# Patient Record
Sex: Male | Born: 1960 | Race: White | Hispanic: No | Marital: Married | Smoking: Never smoker
Health system: Southern US, Community
[De-identification: ages and names within clinical notes are randomized; demographics above are authoritative.]

## PROBLEM LIST (undated history)

## (undated) DIAGNOSIS — G4733 Obstructive sleep apnea (adult) (pediatric): Secondary | ICD-10-CM

## (undated) HISTORY — PX: UVULOPALATOPHARYNGOPLASTY (UPPP)/TONSILLECTOMY/SEPTOPLASTY: SHX6164

## (undated) HISTORY — DX: Obstructive sleep apnea (adult) (pediatric): G47.33

---

## 1998-09-05 ENCOUNTER — Ambulatory Visit: Admission: RE | Admit: 1998-09-05 | Discharge: 1998-09-05 | Payer: Self-pay | Admitting: Otolaryngology

## 1999-03-01 ENCOUNTER — Encounter: Payer: Self-pay | Admitting: Otolaryngology

## 1999-03-02 ENCOUNTER — Encounter (INDEPENDENT_AMBULATORY_CARE_PROVIDER_SITE_OTHER): Payer: Self-pay | Admitting: *Deleted

## 1999-03-02 ENCOUNTER — Ambulatory Visit (HOSPITAL_COMMUNITY): Admission: RE | Admit: 1999-03-02 | Discharge: 1999-03-03 | Payer: Self-pay | Admitting: Otolaryngology

## 1999-06-26 ENCOUNTER — Ambulatory Visit: Admission: RE | Admit: 1999-06-26 | Discharge: 1999-06-26 | Payer: Self-pay | Admitting: Otolaryngology

## 2008-04-12 ENCOUNTER — Ambulatory Visit (HOSPITAL_BASED_OUTPATIENT_CLINIC_OR_DEPARTMENT_OTHER): Admission: RE | Admit: 2008-04-12 | Discharge: 2008-04-12 | Payer: Self-pay | Admitting: Orthopaedic Surgery

## 2010-05-24 LAB — POCT HEMOGLOBIN-HEMACUE: Hemoglobin: 16.4 g/dL (ref 13.0–17.0)

## 2010-06-26 NOTE — Op Note (Signed)
Jared Contreras, GRABEL                 ACCOUNT NO.:  [id number]   MEDICAL RECORD NO.:  [id number]          PATIENT TYPE:  AMB   LOCATION:  [facility]                          FACILITY:  [facility]   PHYSICIAN:  Lubertha Basque. Dalldorf, M.D.DATE OF BIRTH:  1960-07-10   DATE OF PROCEDURE:  04/12/2008  DATE OF DISCHARGE:                               OPERATIVE REPORT   PREOPERATIVE DIAGNOSIS:  Right first metatarsal fracture.   POSTOPERATIVE DIAGNOSIS:  Right first metatarsal fracture.   PROCEDURE:  Right first metatarsal open reduction and internal fixation.   ANESTHESIA:  General and block.   ATTENDING SURGEON:  Lubertha Basque. Jerl Santos, MD   ASSISTANT:  Lindwood Qua, PA   INDICATIONS FOR PROCEDURE:  The patient is a 50 year old man who was  injured on the job when a large steel plate fell on his foot about a  week back.  He suffered displaced first metatarsal fracture.  He is  offered ORIF in hopes of realigning his foot.  Informed operative  consent was obtained after discussion of possible complications  including reaction to anesthesia, infection, and nonunion.   SUMMARY, FINDINGS, AND PROCEDURE:  Under general anesthesia and a block,  through a dorsomedial longitudinal incision, the first metatarsal was  reduced anatomically and stabilized with a one-third tubular Synthes  plate with 4 bicortical screws.  I used fluoroscopy throughout the case  to make appropriate intraoperative decisions and read all these views  myself.  Bryna Colander assisted throughout and was invaluable to the  completion of the case, mostly in that he maintained reduction while I  placed hardware.  He also closed simultaneously to help minimize OR  time.   DESCRIPTION OF PROCEDURE:  The patient was taken to the operating suite  where general anesthetic was applied without difficulty.  He was also  given a bit of a block in the operating room.  He was then positioned  supine and prepped and draped in normal sterile fashion.   After  administration of IV Kefzol, the right leg was elevated, exsanguinated,  and tourniquet inflated about the calf.  A longitudinal dorsomedial  incision was made along the first metatarsal shaft.  Dissection was  carried down and some subperiosteal dissection was performed.  His  fracture was visualized and reduced anatomically and stabilized with a  reduction clamp.  We then contoured a one-third tubular 4-hole plate  appropriately and placed this along with bone.  We then placed 4  bicortical screws through excellent bone.  We achieved excellent  fixation and elected to use the one-third tubular plate as the bone was  in a fairly subcutaneous position and I did not think he would tolerate  the DCP plate which we would normally use for fractures.  Nevertheless,  fluoroscopy was used to confirm excellent reduction of the fracture and  placement of hardware.  The wound was irrigated and the tourniquet was  deflated.  His toes became pink and warm immediately.  Mild amount of  bleeding was easily controlled with some pressure.  The wound was  irrigated again, followed by reapproximation of deep tissues  with 2-0  undyed Vicryl and skin with nylon.  Adaptic was applied followed by dry  gauze and an Ace wrap and posterior splint of plaster with ankle in  neutral position.  Estimated blood loss and intraoperative fluids  obtained from anesthesia records as can accurate tourniquet time.   DISPOSITION:  The patient was extubated in the operating room and taken  to recovery room in stable addition.  He is to go home same-day and  follow up with me in the office less than a week.  I will contact him by  phone tonight.      Lubertha Basque Jerl Santos, M.D.  Electronically Signed     PGD/MEDQ  D:  04/12/2008  T:  04/13/2008  Job:  [id number]

## 2011-10-03 ENCOUNTER — Other Ambulatory Visit: Payer: Self-pay | Admitting: Gastroenterology

## 2011-10-03 DIAGNOSIS — R1310 Dysphagia, unspecified: Secondary | ICD-10-CM

## 2011-10-09 ENCOUNTER — Ambulatory Visit
Admission: RE | Admit: 2011-10-09 | Discharge: 2011-10-09 | Disposition: A | Discharge status: Home / Self Care | Payer: 59 | Source: Ambulatory Visit | Attending: Gastroenterology | Admitting: Gastroenterology

## 2011-10-09 DIAGNOSIS — R1310 Dysphagia, unspecified: Secondary | ICD-10-CM

## 2011-10-31 ENCOUNTER — Other Ambulatory Visit: Payer: Self-pay | Admitting: Gastroenterology

## 2013-04-29 ENCOUNTER — Encounter: Payer: Self-pay | Admitting: Pulmonary Disease

## 2013-04-29 ENCOUNTER — Ambulatory Visit (INDEPENDENT_AMBULATORY_CARE_PROVIDER_SITE_OTHER): Payer: 59 | Admitting: Pulmonary Disease

## 2013-04-29 VITALS — BP 130/80 | HR 64 | Ht 73.0 in | Wt 211.0 lb

## 2013-04-29 DIAGNOSIS — R0683 Snoring: Secondary | ICD-10-CM

## 2013-04-29 DIAGNOSIS — R0989 Other specified symptoms and signs involving the circulatory and respiratory systems: Secondary | ICD-10-CM

## 2013-04-29 DIAGNOSIS — G4733 Obstructive sleep apnea (adult) (pediatric): Secondary | ICD-10-CM | POA: Insufficient documentation

## 2013-04-29 DIAGNOSIS — R0609 Other forms of dyspnea: Secondary | ICD-10-CM

## 2013-04-29 NOTE — Patient Instructions (Signed)
Will arrange for sleep study Will call to arrange for follow up after sleep study reviewed 

## 2013-04-29 NOTE — Progress Notes (Signed)
Chief Complaint  Patient presents with  . Sleep Consult    referred by Dr. Shelia Media for OSA. Epworth Score: 7.    History of Present Illness: Jared Contreras is a 53 y.o. male for evaluation of sleep problems.  He had sleep study about 12 years ago, and was found to have severe sleep apnea.  He tried CPAP, but couldn't tolerate this.  He then had UPPP.  This helped initially, but he was told his sleep apnea was still severe.  He then retried CPAP, but couldn't tolerate again.  As a result he stopped CPAP.  His sleep has gotten worse over the past few years.  He is snoring again, and his wife is concerned he stops breathing while asleep.  He goes to sleep at 11 pm.  He falls asleep quickly.  He wakes up 2 times to use the bathroom.  He gets out of bed at 630 am.  He feels okay in the morning.  He denies morning headache.  He does not use anything to help him fall sleep.  He drinks 2 cups of coffee in the morning.  He denies sleep walking, sleep talking, bruxism, or nightmares.  There is no history of restless legs.  He denies sleep hallucinations, sleep paralysis, or cataplexy.  The Epworth score is 7 out of 24.  Jared Contreras  has a past medical history of OSA (obstructive sleep apnea).  Jared Contreras  has past surgical history that includes Uvulopalatopharyngoplasty (uppp)/tonsillectomy/septoplasty.  Prior to Admission medications   Not on File    Allergies  Allergen Reactions  . Asa [Aspirin] Anaphylaxis    His family history includes Alcoholism in his father; Skin cancer in his mother; Suicidality in his father.  He  reports that he has never smoked. He has never used smokeless tobacco. He reports that he does not drink alcohol or use illicit drugs.   Physical Exam:  General - No distress ENT - No sinus tenderness, no oral exudate, no LAN, no thyromegaly, TM clear, pupils equal/reactive, changes of UPPP, narrow jaw, retrognathic Cardiac - s1s2 regular, no murmur, pulses  symmetric Chest - No wheeze/rales/dullness, good air entry, normal respiratory excursion Back - No focal tenderness Abd - Soft, non-tender, no organomegaly, + bowel sounds Ext - No edema Neuro - Normal strength, cranial nerves intact Skin - No rashes Psych - Normal mood, and behavior  Assessment/plan:  [name], M.D. Pager [phone]

## 2013-04-29 NOTE — Assessment & Plan Note (Signed)
He has prior history of sleep apnea.  He is s/p UPPP, but has progression of symptoms of sleep disruption, apnea, snoring, and daytime sleepiness.  I am concerned he still has sleep apnea.  We discussed how sleep apnea can affect various health problems including risks for hypertension, cardiovascular disease, and diabetes.  We also discussed how sleep disruption can increase risks for accident, such as while driving.  Weight loss as a means of improving sleep apnea was also reviewed.  Additional treatment options discussed were CPAP therapy, oral appliance, and surgical intervention.  To further assess will arrange for in lab sleep study.  He may be a good candidate for oral appliance.

## 2013-06-04 ENCOUNTER — Ambulatory Visit (HOSPITAL_BASED_OUTPATIENT_CLINIC_OR_DEPARTMENT_OTHER): Payer: 59 | Attending: Pulmonary Disease

## 2013-06-04 VITALS — Ht 73.0 in | Wt 208.0 lb

## 2013-06-04 DIAGNOSIS — R0989 Other specified symptoms and signs involving the circulatory and respiratory systems: Secondary | ICD-10-CM | POA: Insufficient documentation

## 2013-06-04 DIAGNOSIS — R0683 Snoring: Secondary | ICD-10-CM

## 2013-06-04 DIAGNOSIS — R0609 Other forms of dyspnea: Secondary | ICD-10-CM | POA: Insufficient documentation

## 2013-06-10 ENCOUNTER — Telehealth: Payer: Self-pay | Admitting: Pulmonary Disease

## 2013-06-10 DIAGNOSIS — R0989 Other specified symptoms and signs involving the circulatory and respiratory systems: Secondary | ICD-10-CM

## 2013-06-10 DIAGNOSIS — R0609 Other forms of dyspnea: Secondary | ICD-10-CM

## 2013-06-10 DIAGNOSIS — G4733 Obstructive sleep apnea (adult) (pediatric): Secondary | ICD-10-CM

## 2013-06-10 NOTE — Telephone Encounter (Signed)
PSG 06/04/13 >> AHI 60.5, SaO2 low 81%.  Sub-optimal titration >> centrals with CPAP and BiPAP.  Will have my nurse schedule ROV to review results.

## 2013-06-10 NOTE — Telephone Encounter (Signed)
lmtcb x1 

## 2013-06-10 NOTE — Sleep Study (Signed)
[place]  NAME: Judith Campillo DATE OF BIRTH:  [birth year] MEDICAL RECORD NUMBER [id number]  LOCATION: [facility]  PHYSICIAN: [name], M.D. DATE OF STUDY: 06/04/2013  SLEEP STUDY TYPE:  Split night protocol               REFERRING PHYSICIAN: [name], MD  INDICATION FOR STUDY:  Jared Contreras is a 53 y.o. male who presents to the sleep lab for evaluation of hypersomnia with obstructive sleep apnea.  He reports snoring, sleep disruption, apnea, and daytime sleepiness.  He has history of sleep apnea and had prior uvulopharyngyoplasty.  EPWORTH SLEEPINESS SCORE: 9. HEIGHT: 6\' 1"  (185.4 cm)  WEIGHT: 208 lb (94.348 kg)    Body mass index is 27.45 kg/(m^2).  NECK SIZE: 15 in.  MEDICATIONS:  No current outpatient prescriptions on file prior to visit.   No current facility-administered medications on file prior to visit.    SLEEP ARCHITECTURE:  Diagnostic portion: Total recording time: 165.5 minutes.  Total sleep time was: 127 minutes.  Sleep efficiency: 76.7%.  Sleep latency: 6 minutes.  REM latency: N/A.  Stage N1: 18.5%.  Stage N2: 81.5%.  Stage N3: 0%.  Stage R:  0%.  Supine sleep: 72.5 minutes.  Non-supine sleep: 54.5 minutes.  Titration portion: Total recording time: 171 minutes.  Total sleep time was: 190.5 minutes.  Sleep efficiency: 89.5%.  Sleep latency: 1 minutes.  REM latency: 78 minutes.  Stage N1: 5%.  Stage N2: 81%.  Stage N3: 2.6%.  Stage R: 11.4%.  Supine sleep: 140 minutes.  Non-supine sleep: 31 minutes.  CARDIAC DATA:  Average heart rate: 68 beats per minute. Rhythm strip: sinus rhythm with PVC's.  RESPIRATORY DATA: Average respiratory rate: 16. Snoring: mild. Average AHI: 60.5.   Apnea index: 29.7.  Hypopnea index: 20.8. Obstructive apnea index: 29.7.  Central apnea index: 0.  Mixed apnea index: 0. REM AHI: N/A.  NREM AHI: 60.5. Supine AHI: 65.4. Non-supine AHI: 53.9.  Titration portion: He was started on  CPAP of 5 and increased to 15 cm H2O.  In spite of high CPAP settings he continued to have respiratory events, and almost exclusively central apneas.  As a result he was transitioned to BiPAP starting at 16/12 cm H2O and eventually increased to 20/16 cm H2O.  He continued to have central apneas in spite of high BiPAP settings.  MOVEMENT/PARASOMNIA:  Diagnostic portion: Periodic limb movement: 0.  Period limb movements with arousals: 0. Restroom trips: one.  Titration portion: Periodic limb movement: 8.4.  Period limb movements with arousals: 0.  OXYGEN DATA:  Baseline oxygenation: 94%. Lowest SaO2: 81%. Time spent below SaO2 90%: 23.3 minutes. Supplemental oxygen used: none.  IMPRESSION/ RECOMMENDATION:    This study shows severe sleep apnea with an AHI of 60.5 and SaO low of 81%.  He had a sub-optimal titration portion of the study.  He developed central apneas while using CPAP.  These persisted while using BiPAP.  Recommend that the patient return to the sleep lab for a full night titration study.   [name], M.D. Diplomate, Tax adviser of Sleep Medicine  ELECTRONICALLY SIGNED ON:  06/10/2013, 7:32 AM [place] PH: [phone]   FX: [phone] [place]

## 2013-06-14 NOTE — Telephone Encounter (Signed)
OV has been scheduled for 06/15/13 at 11:30am.

## 2013-06-15 ENCOUNTER — Ambulatory Visit (INDEPENDENT_AMBULATORY_CARE_PROVIDER_SITE_OTHER): Payer: 59 | Admitting: Pulmonary Disease

## 2013-06-15 ENCOUNTER — Encounter: Payer: Self-pay | Admitting: Pulmonary Disease

## 2013-06-15 VITALS — BP 126/84 | HR 76 | Ht 72.0 in | Wt 211.0 lb

## 2013-06-15 DIAGNOSIS — G4733 Obstructive sleep apnea (adult) (pediatric): Secondary | ICD-10-CM

## 2013-06-15 NOTE — Patient Instructions (Signed)
Will arrange for CPAP titration sleep study Will call to arrange for follow up after sleep study reviewed  

## 2013-06-15 NOTE — Progress Notes (Signed)
Chief Complaint  Patient presents with  . Sleep Apnea    review sleep study    History of Present Illness: Jared Contreras is a 53 y.o. male with OSA.  He is here to review his sleep study >> this showed severe sleep apnea.   TESTS: PSG 06/04/13 >> AHI 60.5, SaO2 low 81%.  Sub-optimal titration >> centrals with CPAP and BiPAP.  Jared Contreras  has a past medical history of OSA (obstructive sleep apnea).  Jared Contreras  has past surgical history that includes Uvulopalatopharyngoplasty (uppp)/tonsillectomy/septoplasty.  Prior to Admission medications   Not on File    Allergies  Allergen Reactions  . Asa [Aspirin] Anaphylaxis     Physical Exam:  General - No distress ENT - No sinus tenderness, no oral exudate, no LAN, changes of UPPP, narrow jaw, retrognathic Cardiac - s1s2 regular, no murmur Chest - No wheeze/rales/dullness Back - No focal tenderness Abd - Soft, non-tender Ext - No edema Neuro - Normal strength Skin - No rashes Psych - normal mood, and behavior   Assessment/Plan:  Jared Mires, MD [place] [facility] Pager:  [phone]

## 2013-06-15 NOTE — Assessment & Plan Note (Signed)
He has severe sleep apnea.  I have reviewed the recent sleep study results with the patient.  We discussed how sleep apnea can affect various health problems including risks for hypertension, cardiovascular disease, and diabetes.  We also discussed how sleep disruption can increase risks for accident, such as while driving.  Weight loss as a means of improving sleep apnea was also reviewed.  Additional treatment options discussed were CPAP therapy, oral appliance, and surgical intervention.  He is willing to try CPAP therapy. Will arrange for CPAP titration >> may need to switch to BiPAP if he develops central apneas again.

## 2013-07-27 IMAGING — RF DG ESOPHAGUS
12 of 24 series · 12 of 24 positions shown · non-contrast
Comparison: None

***ADDENDUM*** CREATED: 10/09/2011 [DATE]

The original report was by Dr. Duvan Parent.  The following
addendum is by Dr. Duvan Parent:
I discussed this case by telephone with Dr. Polin Billiot at [DATE] p.m.
on 10/09/2011.  On this particular exam, a barium tablet was not
administered due to obvious patency and lack of narrowing/
stricture along the distended esophagram images.  Accordingly,
barium tablet administration was felt to be superfluous in this
case and was omitted in order to reduce radiation dose.
CLINICAL DATA: Dysphagia
ESOPHAGUS/BARIUM SWALLOW/TABLET STUDY
TECHNIQUE: Double contrast esophagram was performed.  Fluoro time:
2.3 minutes

[Series 2: run · 1 of 8 slices shown (1 of 12)]
[im 1/8]
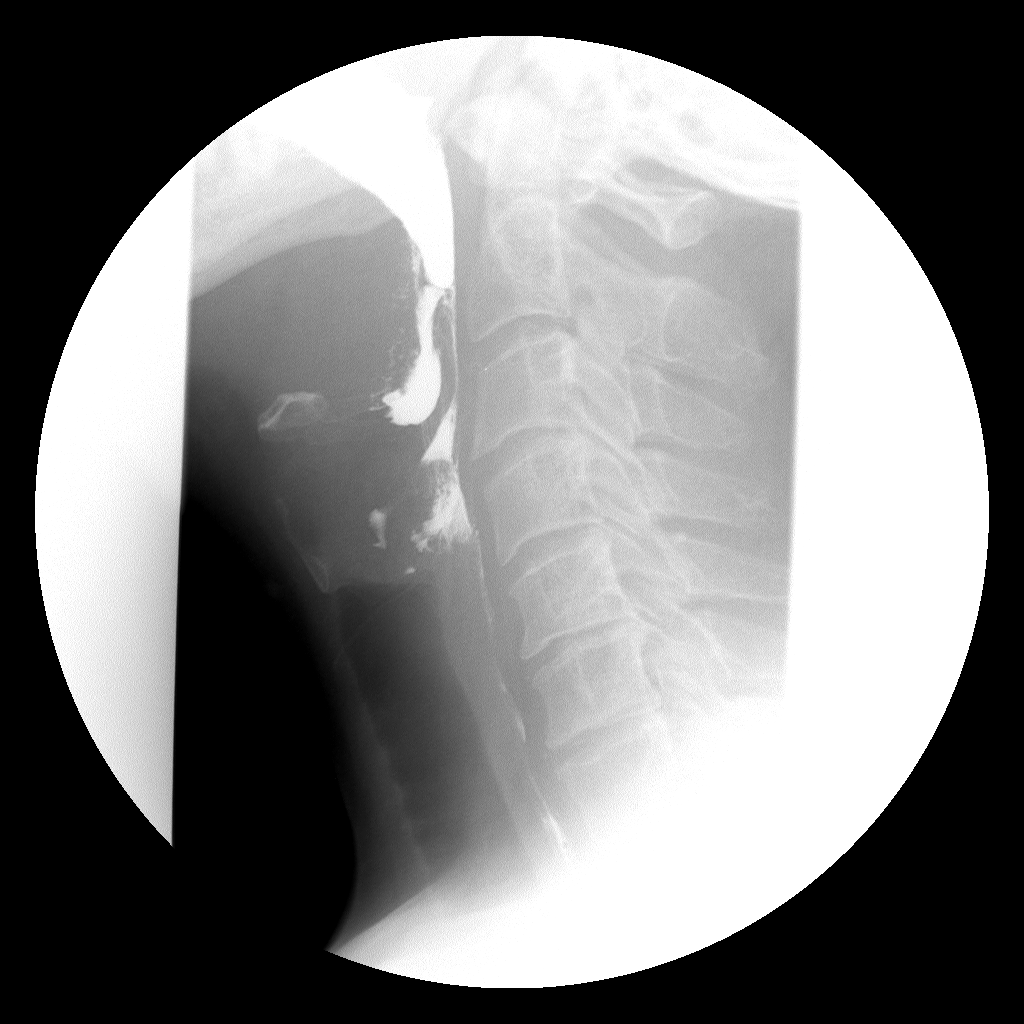

[Series 4: run · 1 of 1 slices shown (2 of 12)]
[im 1/1]
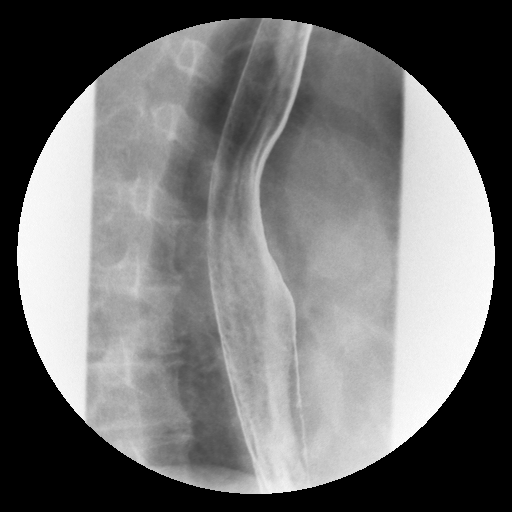

[Series 6: run · 1 of 1 slices shown (3 of 12)]
[im 1/1]
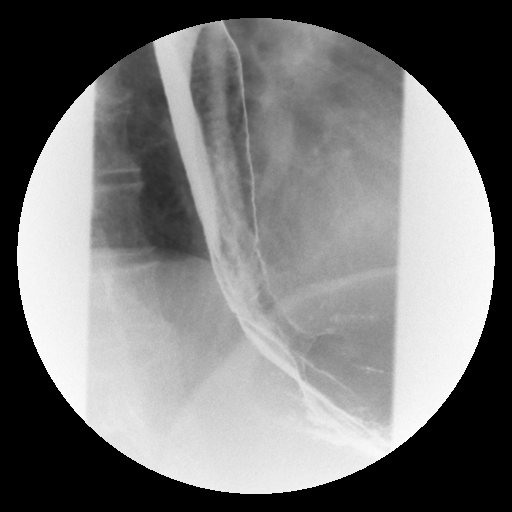

[Series 8: run · 1 of 1 slices shown (4 of 12)]
[im 1/1]
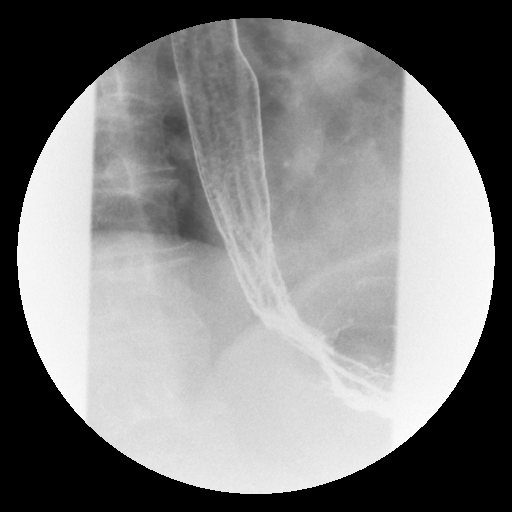

[Series 10: run · 1 of 1 slices shown (5 of 12)]
[im 1/1]
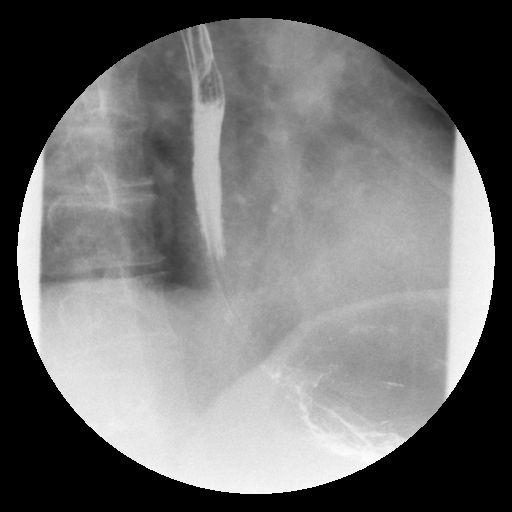

[Series 12: run · 1 of 1 slices shown (6 of 12)]
[im 1/1]
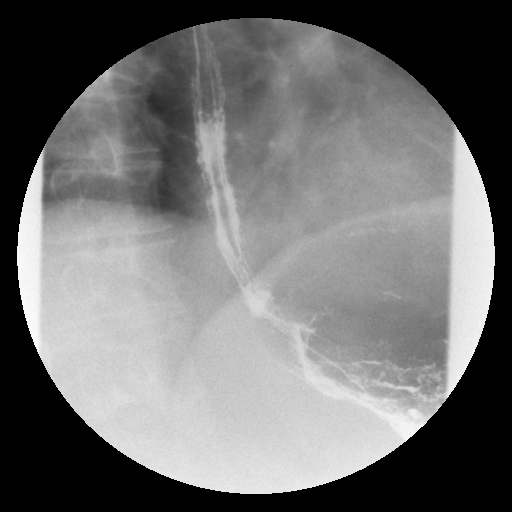

[Series 14: run · 1 of 1 slices shown (7 of 12)]
[im 1/1]
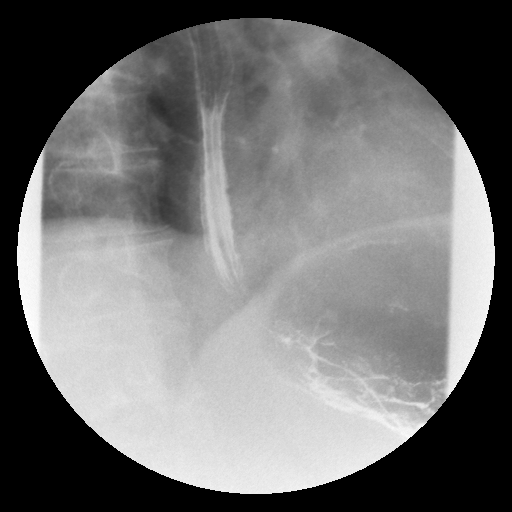

[Series 16: run · 1 of 1 slices shown (8 of 12)]
[im 1/1]
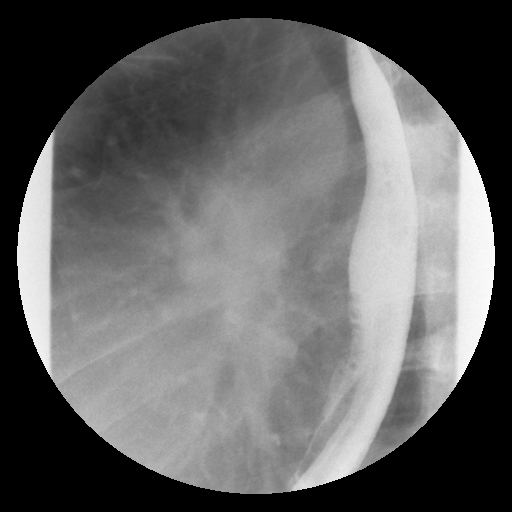

[Series 18: run · 1 of 1 slices shown (9 of 12)]
[im 1/1]
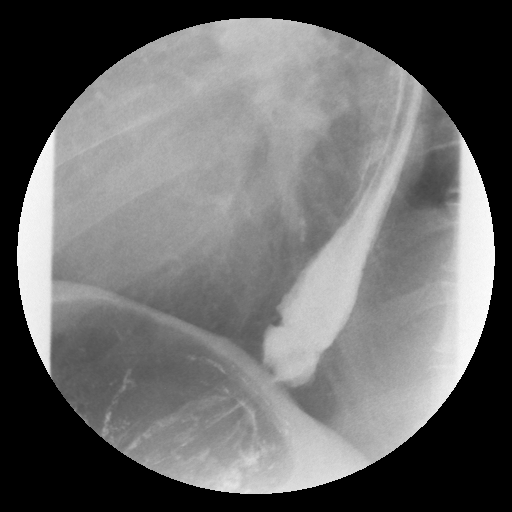

[Series 20: run · 1 of 1 slices shown (10 of 12)]
[im 1/1]
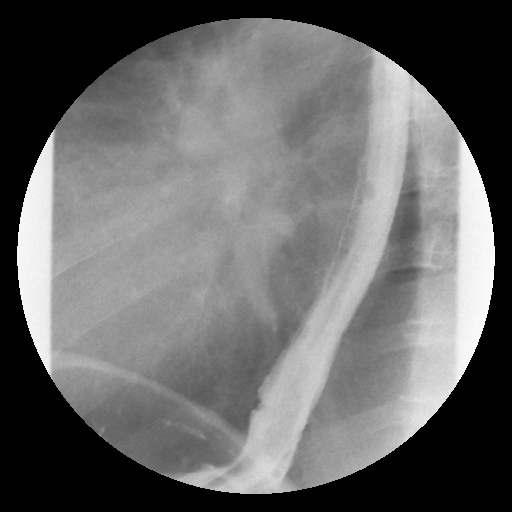

[Series 22: run · 1 of 1 slices shown (11 of 12)]
[im 1/1]
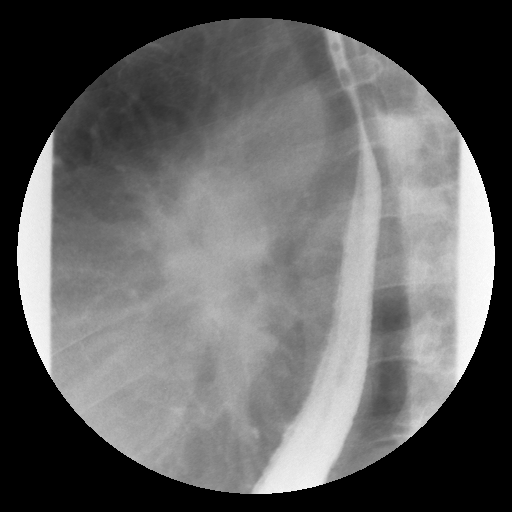

[Series 24: run · 1 of 1 slices shown (12 of 12)]
[im 1/1]
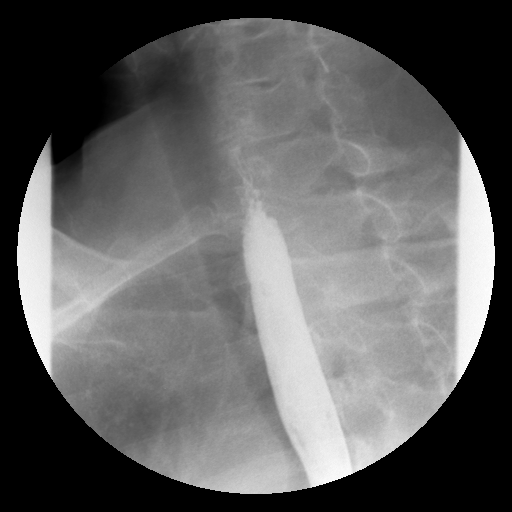

[12 of 24 positions shown; findings below may reference images not displayed]

FINDINGS: The pharyngeal phase of swallowing demonstrates mild
laryngeal penetration of contrast medium.

A double contrast imaging of the esophagus demonstrates a distal
"feline" esophagus appearance.

A small hiatal hernia is present.

Primary peristaltic waves of the esophagus were mildly sluggish,
with the wave traveling the length of the esophagus but with
esophageal retention of about half of the contrast bolus volume on
[DATE] individual swallows.

No stricture observed.
IMPRESSION: 1.  "Feline" esophagus appearance on double contrast suggests mild
esophagitis.  No stricture or ulceration identified.
2.  Small hiatal hernia.
3.  Equivocal esophageal dysmotility; primary peristaltic waves
were continuous but there is retention of a significant amount of
contrast in the esophagus suggesting sluggish peristalsis.
4.  Mild laryngeal penetration of contrast medium during the
pharyngeal phase of swallowing.  This is likely from slight delay
in epiglottic inversion during swallowing.

## 2013-07-30 ENCOUNTER — Ambulatory Visit (HOSPITAL_BASED_OUTPATIENT_CLINIC_OR_DEPARTMENT_OTHER): Payer: 59 | Attending: Pulmonary Disease | Admitting: Radiology

## 2013-07-30 VITALS — Ht 73.0 in | Wt 208.0 lb

## 2013-07-30 DIAGNOSIS — G4733 Obstructive sleep apnea (adult) (pediatric): Secondary | ICD-10-CM

## 2013-07-30 DIAGNOSIS — G4730 Sleep apnea, unspecified: Secondary | ICD-10-CM | POA: Insufficient documentation

## 2013-08-03 ENCOUNTER — Telehealth: Payer: Self-pay | Admitting: Pulmonary Disease

## 2013-08-03 DIAGNOSIS — G4733 Obstructive sleep apnea (adult) (pediatric): Secondary | ICD-10-CM

## 2013-08-03 DIAGNOSIS — G4731 Primary central sleep apnea: Secondary | ICD-10-CM

## 2013-08-03 NOTE — Sleep Study (Signed)
[name]  NAME: Jared Contreras DATE OF BIRTH:  1960-07-11 MEDICAL RECORD NUMBER [id number]  [facility]  PHYSICIAN: [name], M.D. DATE OF STUDY: 07/30/2013  SLEEP STUDY TYPE: CPAP/BiPAP Titration               REFERRING PHYSICIAN: [name], MD  INDICATION FOR STUDY:  Jared Contreras is a 53 y.o. male who was found to have severe sleep apnea on sleep study from 06/04/13 showing an AHI of 60.5.  He had a sup-optimal titration portion of his split night protocol due to the development of central apneas.  He returns to the sleep lab for a full night titration study.  EPWORTH SLEEPINESS SCORE: 9. HEIGHT: 6\' 1"  (185.4 cm)  WEIGHT: 208 lb (94.348 kg)    Body mass index is 27.45 kg/(m^2).  NECK SIZE: 15 in.  MEDICATIONS:  No current outpatient prescriptions on file prior to visit.   No current facility-administered medications on file prior to visit.    SLEEP ARCHITECTURE:  Total recording time: 388 minutes.  Total sleep time was: 329.5 minutes.  Sleep efficiency: 84.9%.  Sleep latency: 16 minutes.  REM latency: 142.5 minutes.  Stage N1: 13.5%.  Stage N2: 56.1%.  Stage N3: 0%.  Stage R:  30.3%.  Supine sleep: 68 minutes.  Non-supine sleep: 261.5 minutes.  CARDIAC DATA:  Average heart rate: 59 beats per minute. Rhythm strip: sinus rhythm.  RESPIRATORY DATA: Average respiratory rate: 12.  He was started on CPAP 5 and increased to 14 cm H2O.  He was transitioned to BiPAP due to the development of central apneas.  He was titrated from BiPAP 16/12 to 21/17 cm H2O.  It appeared that BiPAP setting of 19/15 cm H2O was most optimal.  However, he continued to have intermittent episodes of central apnea which where most pronounced in the supine position.  MOVEMENT/PARASOMNIA:  Periodic limb movement: 2.2.  Period limb movements with arousals: 0.2. Restroom trips: none.  OXYGEN DATA:  Baseline oxygenation: 96%. Lowest SaO2: 89%. Time  spent below SaO2 90%: 0.8 minutes. Supplemental oxygen used: none.  IMPRESSION/ RECOMMENDATION:   He appeared to have most optimal control of his apnea with BiPAP 19/15 cm H2O.  He continued to have intermittent central apneas which where most pronounced in the supine position.  He was fitted with a medium size ResMed Mirage Quattro full face mask.  [name], M.D. Diplomate, Tax adviser of Sleep Medicine  ELECTRONICALLY SIGNED ON:  08/03/2013, 7:35 AM [place] PH: [phone]   FX: [phone] [place]

## 2013-08-03 NOTE — Telephone Encounter (Signed)
LMOM x 1 

## 2013-08-03 NOTE — Telephone Encounter (Signed)
BiPAP 07/30/13 >> BiPAP 19/15 >> most optimal.  Continued to have centrals in supine position.  Will have my nurse inform pt that sleep study reviewed, and optimal initial pressure settings for BiPAP ar 19/15 cm H2O.  I have sent order to start on BiPAP.  He will need ROV 2 months after set up.

## 2013-08-05 NOTE — Telephone Encounter (Signed)
LMOM x 2 

## 2013-08-06 NOTE — Telephone Encounter (Signed)
LMOM x 3 Letter mailed to patient requesting pt to contact our office for results and appointment. Will close message.

## 2013-10-16 ENCOUNTER — Emergency Department (HOSPITAL_BASED_OUTPATIENT_CLINIC_OR_DEPARTMENT_OTHER)
Admission: EM | Admit: 2013-10-16 | Discharge: 2013-10-17 | Disposition: A | Discharge status: Home / Self Care | Payer: 59 | Attending: Emergency Medicine | Admitting: Emergency Medicine

## 2013-10-16 DIAGNOSIS — R319 Hematuria, unspecified: Secondary | ICD-10-CM | POA: Insufficient documentation

## 2013-10-16 DIAGNOSIS — Z79899 Other long term (current) drug therapy: Secondary | ICD-10-CM | POA: Insufficient documentation

## 2013-10-16 DIAGNOSIS — Z8669 Personal history of other diseases of the nervous system and sense organs: Secondary | ICD-10-CM | POA: Diagnosis not present

## 2013-10-16 DIAGNOSIS — N410 Acute prostatitis: Secondary | ICD-10-CM | POA: Diagnosis not present

## 2013-10-16 LAB — URINALYSIS, ROUTINE W REFLEX MICROSCOPIC
Glucose, UA: NEGATIVE mg/dL
Ketones, ur: 15 mg/dL — AB
Nitrite: POSITIVE — AB
PH: 6 (ref 5.0–8.0)
Protein, ur: 100 mg/dL — AB
Specific Gravity, Urine: 1.023 (ref 1.005–1.030)
Urobilinogen, UA: 1 mg/dL (ref 0.0–1.0)

## 2013-10-16 LAB — CBC WITH DIFFERENTIAL/PLATELET
BASOS ABS: 0 10*3/uL (ref 0.0–0.1)
BASOS PCT: 0 % (ref 0–1)
Eosinophils Absolute: 0.1 10*3/uL (ref 0.0–0.7)
Eosinophils Relative: 1 % (ref 0–5)
HCT: 44 % (ref 39.0–52.0)
Hemoglobin: 14.8 g/dL (ref 13.0–17.0)
Lymphocytes Relative: 10 % — ABNORMAL LOW (ref 12–46)
Lymphs Abs: 1.2 10*3/uL (ref 0.7–4.0)
MCH: 31 pg (ref 26.0–34.0)
MCHC: 33.6 g/dL (ref 30.0–36.0)
MCV: 92.1 fL (ref 78.0–100.0)
Monocytes Absolute: 0.8 10*3/uL (ref 0.1–1.0)
Monocytes Relative: 6 % (ref 3–12)
NEUTROS ABS: 10.3 10*3/uL — AB (ref 1.7–7.7)
NEUTROS PCT: 83 % — AB (ref 43–77)
Platelets: 251 10*3/uL (ref 150–400)
RBC: 4.78 MIL/uL (ref 4.22–5.81)
RDW: 12.9 % (ref 11.5–15.5)
WBC: 12.5 10*3/uL — ABNORMAL HIGH (ref 4.0–10.5)

## 2013-10-16 LAB — BASIC METABOLIC PANEL
ANION GAP: 12 (ref 5–15)
BUN: 16 mg/dL (ref 6–23)
CO2: 26 mEq/L (ref 19–32)
CREATININE: 1.2 mg/dL (ref 0.50–1.35)
Calcium: 9.6 mg/dL (ref 8.4–10.5)
Chloride: 100 mEq/L (ref 96–112)
GFR calc non Af Amer: 66 mL/min — ABNORMAL LOW (ref >90)
GFR, EST AFRICAN AMERICAN: 77 mL/min — AB (ref >90)
Glucose, Bld: 122 mg/dL — ABNORMAL HIGH (ref 70–99)
POTASSIUM: 4.4 meq/L (ref 3.7–5.3)
Sodium: 138 mEq/L (ref 137–147)

## 2013-10-16 LAB — URINE MICROSCOPIC-ADD ON

## 2013-10-16 LAB — I-STAT CG4 LACTIC ACID, ED: Lactic Acid, Venous: 0.7 mmol/L (ref 0.5–2.2)

## 2013-10-16 MED ORDER — MORPHINE SULFATE 4 MG/ML IJ SOLN
4.0000 mg | INTRAMUSCULAR | Status: DC | PRN
  Filled 2013-10-16: qty 1

## 2013-10-16 MED ORDER — LEVOFLOXACIN 750 MG PO TABS: 750.0000 mg | ORAL_TABLET | Freq: Every day | ORAL | Status: DC

## 2013-10-16 MED ORDER — ACETAMINOPHEN 325 MG PO TABS
975.0000 mg | ORAL_TABLET | Freq: Once | ORAL | Status: AC
  Administered 2013-10-16: 975 mg via ORAL
  Filled 2013-10-16: qty 3

## 2013-10-16 MED ORDER — SODIUM CHLORIDE 0.9 % IV BOLUS (SEPSIS)
1000.0000 mL | Freq: Once | INTRAVENOUS | Status: AC
  Administered 2013-10-16: 1000 mL via INTRAVENOUS

## 2013-10-16 MED ORDER — LEVOFLOXACIN IN D5W 750 MG/150ML IV SOLN
750.0000 mg | Freq: Once | INTRAVENOUS | Status: AC
  Administered 2013-10-16: 750 mg via INTRAVENOUS
  Filled 2013-10-16: qty 150

## 2013-10-16 NOTE — Discharge Instructions (Signed)
Take your antibiotics as directed and to completion. You should never have any leftover antibiotics! Push fluids and stay well hydrated.   Please follow with your primary care doctor in the next 2 days for a check-up. They must obtain records for further management.   Do not hesitate to return to the Emergency Department for any new, worsening or concerning symptoms.    Prostatitis The prostate gland is about the size and shape of a walnut. It is located just below your bladder. It produces one of the components of semen, which is made up of sperm and the fluids that help nourish and transport it out from the testicles. Prostatitis is inflammation of the prostate gland.  There are four types of prostatitis:  Acute bacterial prostatitis. This is the least common type of prostatitis. It starts quickly and usually is associated with a bladder infection, high fever, and shaking chills. It can occur at any age.  Chronic bacterial prostatitis. This is a persistent bacterial infection in the prostate. It usually develops from repeated acute bacterial prostatitis or acute bacterial prostatitis that was not properly treated. It can occur in men of any age but is most common in middle-aged men whose prostate has begun to enlarge. The symptoms are not as severe as those in acute bacterial prostatitis. Discomfort in the part of your body that is in front of your rectum and below your scrotum (perineum), lower abdomen, or in the head of your penis (glans) may represent your primary discomfort.  Chronic prostatitis (nonbacterial). This is the most common type of prostatitis. It is inflammation of the prostate gland that is not caused by a bacterial infection. The cause is unknown and may be associated with a viral infection or autoimmune disorder.  Prostatodynia (pelvic floor disorder). This is associated with increased muscular tone in the pelvis surrounding the prostate. CAUSES The causes of bacterial  prostatitis are bacterial infection. The causes of the other types of prostatitis are unknown.  SYMPTOMS  Symptoms can vary depending upon the type of prostatitis that exists. There can also be overlap in symptoms. Possible symptoms for each type of prostatitis are listed below. Acute Bacterial Prostatitis  Painful urination.  Fever or chills.  Muscle or joint pains.  Low back pain.  Low abdominal pain.  Inability to empty bladder completely. Chronic Bacterial Prostatitis, Chronic Nonbacterial Prostatitis, and Prostatodynia  Sudden urge to urinate.  Frequent urination.  Difficulty starting urine stream.  Weak urine stream.  Discharge from the urethra.  Dribbling after urination.  Rectal pain.  Pain in the testicles, penis, or tip of the penis.  Pain in the perineum.  Problems with sexual function.  Painful ejaculation.  Bloody semen. DIAGNOSIS  In order to diagnose prostatitis, your health care provider will ask about your symptoms. One or more urine samples will be taken and tested (urinalysis). If the urinalysis result is negative for bacteria, your health care provider may use a finger to feel your prostate (digital rectal exam). This exam helps your health care provider determine if your prostate is swollen and tender. It will also produce a specimen of semen that can be analyzed. TREATMENT  Treatment for prostatitis depends on the cause. If a bacterial infection is the cause, it can be treated with antibiotic medicine. In cases of chronic bacterial prostatitis, the use of antibiotics for up to 1 month or 6 weeks may be necessary. Your health care provider may instruct you to take sitz baths to help relieve pain. A sitz  bath is a bath of hot water in which your hips and buttocks are under water. This relaxes the pelvic floor muscles and often helps to relieve the pressure on your prostate. HOME CARE INSTRUCTIONS   Take all medicines as directed by your health care  provider.  Take sitz baths as directed by your health care provider. SEEK MEDICAL CARE IF:   Your symptoms get worse, not better.  You have a fever. SEEK IMMEDIATE MEDICAL CARE IF:   You have chills.  You feel nauseous or vomit.  You feel lightheaded or faint.  You are unable to urinate.  You have blood or blood clots in your urine. MAKE SURE YOU:  Understand these instructions.  Will watch your condition.  Will get help right away if you are not doing well or get worse. Document Released: 01/26/2000 Document Revised: 02/02/2013 Document Reviewed: 08/17/2012 [facility] Patient Information 2015 [place], [place]. This information is not intended to replace advice given to you by your health care provider. Make sure you discuss any questions you have with your health care provider.

## 2013-10-16 NOTE — ED Notes (Signed)
Pt reports x1 week ago had episode of weakness and near syncope while on commode and has had difficulty with urination since then. Hs note his urine very dark and suspects blood

## 2013-10-16 NOTE — ED Provider Notes (Signed)
CSN: [id number]     Arrival date & time 10/16/13  2134 History   First MD Initiated Contact with Patient 10/16/13 2224     Chief Complaint  Patient presents with  . Hematuria     (Consider location/radiation/quality/duration/timing/severity/associated sxs/prior Treatment) Patient is a 53 y.o. male presenting with hematuria.  Hematuria    Jared Contreras is a 53 y.o. male complaining of hematuria onset this afternoon with associated chills, dysuria, urinary frequency and sensation of having to defecate and urinate at the same time. States that this is similar to episode of prostatitis that he had 4 years ago. Patient denies fever, vomiting, history of kidney stones, flank pain, abdominal pain, change in bowel movements, rash or lesions.  Past Medical History  Diagnosis Date  . OSA (obstructive sleep apnea)    Past Surgical History  Procedure Laterality Date  . Uvulopalatopharyngoplasty (uppp)/tonsillectomy/septoplasty     Family History  Problem Relation Age of Onset  . Alcoholism Father   . Skin cancer Mother   . Suicidality Father    History  Substance Use Topics  . Smoking status: Never Smoker   . Smokeless tobacco: Never Used  . Alcohol Use: No    Review of Systems  Genitourinary: Positive for hematuria.    10 systems reviewed and found to be negative, except as noted in the HPI.   Allergies  Asa  Home Medications   Prior to Admission medications   Medication Sig Start Date End Date Taking? Authorizing Provider  levofloxacin (LEVAQUIN) 750 MG tablet Take 1 tablet (750 mg total) by mouth daily. 10/16/13   Amberly Livas, PA-C   BP 136/69  Pulse 94  Temp(Src) 99.6 F (37.6 C) (Oral)  Resp 18  Ht 6' (1.829 m)  Wt 205 lb (92.987 kg)  BMI 27.80 kg/m2  SpO2 96% Physical Exam  Nursing note and vitals reviewed. Constitutional: He is oriented to person, place, and time. He appears well-developed and well-nourished. No distress.  HENT:  Head: Normocephalic.   Eyes: Conjunctivae and EOM are normal.  Cardiovascular: Normal rate, regular rhythm and intact distal pulses.   Pulmonary/Chest: Effort normal. No stridor.  Genitourinary:  Rectal exam chaperoned by nurse:  No rashes or lesions, normal rectal tone, normal  stool color. Mild tenderness to palpation of  the prostate.  Musculoskeletal: Normal range of motion.  Neurological: He is alert and oriented to person, place, and time.  Psychiatric: He has a normal mood and affect.    ED Course  Procedures (including critical care time) Labs Review Labs Reviewed  URINALYSIS, ROUTINE W REFLEX MICROSCOPIC - Abnormal; Notable for the following:    Color, Urine BROWN (*)    APPearance TURBID (*)    Hgb urine dipstick LARGE (*)    Bilirubin Urine MODERATE (*)    Ketones, ur 15 (*)    Protein, ur 100 (*)    Nitrite POSITIVE (*)    Leukocytes, UA LARGE (*)    All other components within normal limits  URINE MICROSCOPIC-ADD ON - Abnormal; Notable for the following:    Bacteria, UA MANY (*)    All other components within normal limits  CBC WITH DIFFERENTIAL - Abnormal; Notable for the following:    WBC 12.5 (*)    Neutrophils Relative % 83 (*)    Neutro Abs 10.3 (*)    Lymphocytes Relative 10 (*)    All other components within normal limits  BASIC METABOLIC PANEL - Abnormal; Notable for the following:    Glucose,  Bld 122 (*)    GFR calc non Af Amer 66 (*)    GFR calc Af Amer 77 (*)    All other components within normal limits  URINE CULTURE  I-STAT CG4 LACTIC ACID, ED    Imaging Review No results found.   EKG Interpretation None      MDM   Final diagnoses:  Prostatitis, acute    Filed Vitals:   10/16/13 2139 10/17/13 0013 10/17/13 0105  BP: 166/82 134/71 136/69  Pulse: 95 98 94  Temp: 99.8 F (37.7 C) 99.5 F (37.5 C) 99.6 F (37.6 C)  TempSrc: Oral Oral Oral  Resp:  16 18  Height: 6' (1.829 m)    Weight: 205 lb (92.987 kg)    SpO2: 96% 98% 96%    Medications   sodium chloride 0.9 % bolus 1,000 mL (0 mLs Intravenous Stopped 10/16/13 2334)  levofloxacin (LEVAQUIN) IVPB 750 mg (0 mg Intravenous Stopped 10/17/13 0124)  acetaminophen (TYLENOL) tablet 975 mg (975 mg Oral Given 10/16/13 2340)    Jared Contreras is a 53 y.o. male presenting with dysuria, urinary frequency, hematuria and rectal pain. Findings consistent with prior episode of prostatitis. Patient will be given fluids and IV Levaquin. Patient will be discharged home with 2 weeks of Levaquin, instructed to follow with both primary care urology  Evaluation does not show pathology that would require ongoing emergent intervention or inpatient treatment. Pt is hemodynamically stable and mentating appropriately. Discussed findings and plan with patient/guardian, who agrees with care plan. All questions answered. Return precautions discussed and outpatient follow up given.   Discharge Medication List as of 10/16/2013 11:59 PM    START taking these medications   Details  levofloxacin (LEVAQUIN) 750 MG tablet Take 1 tablet (750 mg total) by mouth daily., Starting 10/16/2013, Until Discontinued, [employer], PA-C 10/17/13 1340

## 2013-10-19 LAB — URINE CULTURE

## 2013-10-21 NOTE — ED Provider Notes (Signed)
Medical screening examination/treatment/procedure(s) were performed by non-physician practitioner and as supervising physician I was immediately available for consultation/collaboration.   EKG Interpretation None        Fredia Sorrow, MD 10/21/13 [phone]

## 2014-06-16 ENCOUNTER — Other Ambulatory Visit (HOSPITAL_COMMUNITY): Payer: Self-pay | Admitting: Gastroenterology

## 2016-02-19 DIAGNOSIS — I493 Ventricular premature depolarization: Secondary | ICD-10-CM | POA: Diagnosis not present

## 2016-04-12 DIAGNOSIS — M7502 Adhesive capsulitis of left shoulder: Secondary | ICD-10-CM | POA: Diagnosis not present

## 2016-05-08 DIAGNOSIS — M7502 Adhesive capsulitis of left shoulder: Secondary | ICD-10-CM | POA: Diagnosis not present

## 2016-05-17 DIAGNOSIS — G4733 Obstructive sleep apnea (adult) (pediatric): Secondary | ICD-10-CM | POA: Diagnosis not present

## 2016-06-10 DIAGNOSIS — M7502 Adhesive capsulitis of left shoulder: Secondary | ICD-10-CM | POA: Diagnosis not present

## 2016-06-18 DIAGNOSIS — M7502 Adhesive capsulitis of left shoulder: Secondary | ICD-10-CM | POA: Diagnosis not present

## 2016-06-18 DIAGNOSIS — M25612 Stiffness of left shoulder, not elsewhere classified: Secondary | ICD-10-CM | POA: Diagnosis not present

## 2016-07-10 DIAGNOSIS — L03818 Cellulitis of other sites: Secondary | ICD-10-CM | POA: Diagnosis not present

## 2016-07-10 DIAGNOSIS — W57XXXA Bitten or stung by nonvenomous insect and other nonvenomous arthropods, initial encounter: Secondary | ICD-10-CM | POA: Diagnosis not present

## 2016-09-26 DIAGNOSIS — G4733 Obstructive sleep apnea (adult) (pediatric): Secondary | ICD-10-CM | POA: Diagnosis not present

## 2017-01-08 DIAGNOSIS — G4733 Obstructive sleep apnea (adult) (pediatric): Secondary | ICD-10-CM | POA: Diagnosis not present

## 2017-07-11 DIAGNOSIS — Z86010 Personal history of colonic polyps: Secondary | ICD-10-CM | POA: Diagnosis not present

## 2017-07-11 DIAGNOSIS — D126 Benign neoplasm of colon, unspecified: Secondary | ICD-10-CM | POA: Diagnosis not present

## 2017-08-01 DIAGNOSIS — G4733 Obstructive sleep apnea (adult) (pediatric): Secondary | ICD-10-CM | POA: Diagnosis not present

## 2017-10-13 ENCOUNTER — Emergency Department (HOSPITAL_BASED_OUTPATIENT_CLINIC_OR_DEPARTMENT_OTHER)
Admission: EM | Admit: 2017-10-13 | Discharge: 2017-10-14 | Disposition: A | Discharge status: Home / Self Care | Payer: 59 | Attending: Emergency Medicine | Admitting: Emergency Medicine

## 2017-10-13 ENCOUNTER — Other Ambulatory Visit: Payer: Self-pay

## 2017-10-13 ENCOUNTER — Encounter (HOSPITAL_BASED_OUTPATIENT_CLINIC_OR_DEPARTMENT_OTHER): Payer: Self-pay

## 2017-10-13 DIAGNOSIS — Z23 Encounter for immunization: Secondary | ICD-10-CM | POA: Insufficient documentation

## 2017-10-13 DIAGNOSIS — Y9389 Activity, other specified: Secondary | ICD-10-CM | POA: Diagnosis not present

## 2017-10-13 DIAGNOSIS — Y998 Other external cause status: Secondary | ICD-10-CM | POA: Insufficient documentation

## 2017-10-13 DIAGNOSIS — S61011A Laceration without foreign body of right thumb without damage to nail, initial encounter: Secondary | ICD-10-CM

## 2017-10-13 DIAGNOSIS — W268XXA Contact with other sharp object(s), not elsewhere classified, initial encounter: Secondary | ICD-10-CM | POA: Insufficient documentation

## 2017-10-13 DIAGNOSIS — Y929 Unspecified place or not applicable: Secondary | ICD-10-CM | POA: Diagnosis not present

## 2017-10-13 DIAGNOSIS — S61411A Laceration without foreign body of right hand, initial encounter: Secondary | ICD-10-CM | POA: Diagnosis not present

## 2017-10-13 MED ORDER — TETANUS-DIPHTH-ACELL PERTUSSIS 5-2.5-18.5 LF-MCG/0.5 IM SUSP
0.5000 mL | Freq: Once | INTRAMUSCULAR | Status: AC
  Administered 2017-10-13: 0.5 mL via INTRAMUSCULAR
  Filled 2017-10-13: qty 0.5

## 2017-10-13 MED ORDER — SODIUM BICARBONATE 4 % IV SOLN
5.0000 mL | Freq: Once | INTRAVENOUS | Status: AC
  Administered 2017-10-13: 5 mL via SUBCUTANEOUS
  Filled 2017-10-13: qty 5

## 2017-10-13 MED ORDER — LIDOCAINE-EPINEPHRINE (PF) 2 %-1:200000 IJ SOLN
10.0000 mL | Freq: Once | INTRAMUSCULAR | Status: AC
  Administered 2017-10-13: 10 mL
  Filled 2017-10-13 (×2): qty 10

## 2017-10-13 NOTE — ED Triage Notes (Signed)
Pt cut right thumb on metal can approx 7pm-lac noted-bleeding controlled-NAD-steady gait

## 2017-10-14 NOTE — ED Provider Notes (Signed)
[facility] EMERGENCY DEPARTMENT Provider Note   CSN: [id number] Arrival date & time: 10/13/17  2240     History   Chief Complaint Chief Complaint  Patient presents with  . Laceration    HPI Jared Contreras is a 57 y.o. male.  The history is provided by the patient.  Laceration   The incident occurred 3 to 5 hours ago. The laceration is located on the right hand. The laceration is 2 cm in size. The laceration mechanism was a a metal edge. The pain is mild. The pain has been constant since onset. He reports no foreign bodies present. His tetanus status is unknown.    Past Medical History:  Diagnosis Date  . OSA (obstructive sleep apnea)     Patient Active Problem List   Diagnosis Date Noted  . OSA (obstructive sleep apnea) 04/29/2013    Past Surgical History:  Procedure Laterality Date  . UVULOPALATOPHARYNGOPLASTY (UPPP)/TONSILLECTOMY/SEPTOPLASTY          Home Medications    Prior to Admission medications   Medication Sig Start Date End Date Taking? Authorizing Provider  levofloxacin (LEVAQUIN) 750 MG tablet Take 1 tablet (750 mg total) by mouth daily. 10/16/13   Pisciotta, Elmyra Ricks, PA-C    Family History Family History  Problem Relation Age of Onset  . Alcoholism Father   . Suicidality Father   . Skin cancer Mother     Social History Social History   Tobacco Use  . Smoking status: Never Smoker  . Smokeless tobacco: Never Used  Substance Use Topics  . Alcohol use: No  . Drug use: No     Allergies   Asa [aspirin]   Review of Systems Review of Systems  Constitutional: Negative for fever.  Skin: Positive for wound.     Physical Exam Updated Vital Signs BP 139/85   Pulse 75   Temp 98.3 F (36.8 C) (Oral)   Resp 16   Ht 1.905 m (6\' 3" )   Wt 93 kg   SpO2 98%   BMI 25.63 kg/m   Physical Exam CONSTITUTIONAL: Well developed/well nourished HEAD: Normocephalic/atraumatic EYES: EOMI ENMT: Mucous membranes moist NECK: supple no  meningeal signs NEURO: Pt is awake/alert/appropriate, moves all extremitiesx4.  No facial droop.   EXTREMITIES: pulses normal/equal, full ROM Laceration to right thumb dorsal aspect.  No active bleeding.  No bone or tendon exposed. SKIN: warm, color normal PSYCH: no abnormalities of mood noted, alert and oriented to situation   ED Treatments / Results  Labs (all labs ordered are listed, but only abnormal results are displayed) Labs Reviewed - No data to display  EKG None  Radiology No results found.  Procedures .Marland KitchenLaceration Repair Date/Time: 10/13/2017 11:30 PM Performed by: Ripley Fraise, MD Authorized by: Ripley Fraise, MD   Consent:    Consent obtained:  Verbal   Consent given by:  Patient   Alternatives discussed:  No treatment Anesthesia (see MAR for exact dosages):    Anesthesia method:  Topical application and local infiltration   Local anesthetic:  Lidocaine 2% WITH epi and sodium bicarbonate Laceration details:    Location:  Finger   Finger location:  R thumb   Length (cm):  2 Repair type:    Repair type:  Simple Pre-procedure details:    Preparation:  Patient was prepped and draped in usual sterile fashion Exploration:    Wound exploration: wound explored through full range of motion and entire depth of wound probed and visualized     Wound  extent: no foreign bodies/material noted, no tendon damage noted, no underlying fracture noted and no vascular damage noted     Contaminated: no   Skin repair:    Repair method:  Sutures   Suture size:  5-0   Suture material:  Prolene   Suture technique:  Simple interrupted   Number of sutures:  3 Approximation:    Approximation:  Close Post-procedure details:    Patient tolerance of procedure:  Tolerated well, no immediate complications   Medications Ordered in ED Medications  lidocaine-EPINEPHrine (XYLOCAINE W/EPI) 2 %-1:200000 (PF) injection 10 mL (10 mLs Infiltration Given 10/13/17 2330)  Tdap (BOOSTRIX)  injection 0.5 mL (0.5 mLs Intramuscular Given 10/13/17 2330)  sodium bicarbonate (NEUT) 4 % injection 5 mL (5 mLs Subcutaneous Given 10/13/17 2330)     Initial Impression / Assessment and Plan / ED Course  I have reviewed the triage vital signs and the nursing notes.   Patient with full range of motion of thumb.  No weakness noted with full extension of right thumb There Is no evidence of tendon laceration. We discussed wound care precautions.   Final Clinical Impressions(s) / ED Diagnoses   Final diagnoses:  Laceration of right thumb without foreign body without damage to nail, initial encounter    ED Discharge Orders    None       Ripley Fraise, MD 10/14/17 [phone]

## 2018-03-30 DIAGNOSIS — L814 Other melanin hyperpigmentation: Secondary | ICD-10-CM | POA: Diagnosis not present

## 2018-03-30 DIAGNOSIS — L570 Actinic keratosis: Secondary | ICD-10-CM | POA: Diagnosis not present

## 2018-03-30 DIAGNOSIS — D485 Neoplasm of uncertain behavior of skin: Secondary | ICD-10-CM | POA: Diagnosis not present

## 2018-03-30 DIAGNOSIS — L820 Inflamed seborrheic keratosis: Secondary | ICD-10-CM | POA: Diagnosis not present

## 2018-03-30 DIAGNOSIS — L821 Other seborrheic keratosis: Secondary | ICD-10-CM | POA: Diagnosis not present

## 2018-04-29 ENCOUNTER — Ambulatory Visit (INDEPENDENT_AMBULATORY_CARE_PROVIDER_SITE_OTHER): Payer: Self-pay | Admitting: Family Medicine

## 2018-04-29 DIAGNOSIS — M545 Low back pain: Secondary | ICD-10-CM | POA: Diagnosis not present

## 2018-05-04 DIAGNOSIS — M4316 Spondylolisthesis, lumbar region: Secondary | ICD-10-CM | POA: Diagnosis not present

## 2018-05-07 DIAGNOSIS — G4733 Obstructive sleep apnea (adult) (pediatric): Secondary | ICD-10-CM | POA: Diagnosis not present

## 2018-06-13 ENCOUNTER — Other Ambulatory Visit: Payer: Self-pay

## 2018-06-13 ENCOUNTER — Emergency Department (HOSPITAL_BASED_OUTPATIENT_CLINIC_OR_DEPARTMENT_OTHER)
Admission: EM | Admit: 2018-06-13 | Discharge: 2018-06-13 | Disposition: A | Discharge status: Home / Self Care | Payer: 59 | Attending: Emergency Medicine | Admitting: Emergency Medicine

## 2018-06-13 ENCOUNTER — Encounter (HOSPITAL_BASED_OUTPATIENT_CLINIC_OR_DEPARTMENT_OTHER): Payer: Self-pay | Admitting: Emergency Medicine

## 2018-06-13 DIAGNOSIS — W293XXA Contact with powered garden and outdoor hand tools and machinery, initial encounter: Secondary | ICD-10-CM | POA: Insufficient documentation

## 2018-06-13 DIAGNOSIS — S81012A Laceration without foreign body, left knee, initial encounter: Secondary | ICD-10-CM | POA: Diagnosis not present

## 2018-06-13 DIAGNOSIS — Y999 Unspecified external cause status: Secondary | ICD-10-CM | POA: Diagnosis not present

## 2018-06-13 DIAGNOSIS — Y929 Unspecified place or not applicable: Secondary | ICD-10-CM | POA: Insufficient documentation

## 2018-06-13 DIAGNOSIS — Y939 Activity, unspecified: Secondary | ICD-10-CM | POA: Insufficient documentation

## 2018-06-13 MED ORDER — LIDOCAINE-EPINEPHRINE 2 %-1:200000 IJ SOLN
20.0000 mL | Freq: Once | INTRAMUSCULAR | Status: AC
Start: 2018-06-13 — End: 2018-06-13
  Administered 2018-06-13: 20 mL
  Filled 2018-06-13: qty 20

## 2018-06-13 MED ORDER — LIDOCAINE-EPINEPHRINE (PF) 2 %-1:200000 IJ SOLN
INTRAMUSCULAR | Status: AC
  Administered 2018-06-13: 20 mL
  Filled 2018-06-13: qty 10

## 2018-06-13 NOTE — ED Provider Notes (Signed)
[facility] EMERGENCY DEPARTMENT Provider Note   CSN: [id number] Arrival date & time: 06/13/18  1210    History   Chief Complaint Chief Complaint  Patient presents with  . Leg Injury    HPI Jared Contreras is a 58 y.o. male.  He is here for an evaluation of a laceration he sustained earlier today.  He said he was using a chainsaw and it caught on his clothing and pulled into his left knee.  He denies any significant pain or limitations.  His tetanus is up-to-date.  No other complaints.     The history is provided by the patient.  Laceration  Location:  Leg Leg laceration location:  L knee Length:  5 Depth:  Through dermis Quality: straight   Bleeding: controlled   Injury mechanism: chainsaw. Pain details:    Quality:  Dull   Severity:  Mild   Timing:  Constant   Progression:  Improving Foreign body present:  No foreign bodies Relieved by:  None tried Worsened by:  Nothing Ineffective treatments:  None tried Tetanus status:  Up to date Associated symptoms: no fever, no focal weakness and no numbness     Past Medical History:  Diagnosis Date  . OSA (obstructive sleep apnea)     Patient Active Problem List   Diagnosis Date Noted  . OSA (obstructive sleep apnea) 04/29/2013    Past Surgical History:  Procedure Laterality Date  . UVULOPALATOPHARYNGOPLASTY (UPPP)/TONSILLECTOMY/SEPTOPLASTY          Home Medications    Prior to Admission medications   Not on File    Family History Family History  Problem Relation Age of Onset  . Alcoholism Father   . Suicidality Father   . Skin cancer Mother     Social History Social History   Tobacco Use  . Smoking status: Never Smoker  . Smokeless tobacco: Never Used  Substance Use Topics  . Alcohol use: No  . Drug use: No     Allergies   Asa [aspirin]   Review of Systems Review of Systems  Constitutional: Negative for fever.  Musculoskeletal: Negative for gait problem.  Skin: Positive for  wound.  Neurological: Negative for focal weakness, weakness and numbness.     Physical Exam Updated Vital Signs BP 135/82 (BP Location: Right Arm)   Pulse 93   Temp 98.5 F (36.9 C) (Oral)   Resp 18   Ht 6' (1.829 m)   Wt 97.5 kg   SpO2 98%   BMI 29.16 kg/m   Physical Exam Vitals signs and nursing note reviewed.  Constitutional:      Appearance: He is well-developed.  HENT:     Head: Normocephalic and atraumatic.  Eyes:     Conjunctiva/sclera: Conjunctivae normal.  Neck:     Musculoskeletal: Neck supple.  Pulmonary:     Effort: Pulmonary effort is normal.  Musculoskeletal: Normal range of motion.        General: Signs of injury present.     Right lower leg: No edema.     Left lower leg: No edema.     Comments: He has an approximate 5 cm linear laceration over his left knee.  It does not appear to have violated any deeper structures.  Extensor mechanism is intact.  Distal neurovascular intact.  No gross foreign bodies.  Skin:    General: Skin is warm and dry.  Neurological:     General: No focal deficit present.     Mental Status: He is  alert and oriented to person, place, and time.     GCS: GCS eye subscore is 4. GCS verbal subscore is 5. GCS motor subscore is 6.     Gait: Gait normal.      ED Treatments / Results  Labs (all labs ordered are listed, but only abnormal results are displayed) Labs Reviewed - No data to display  EKG None  Radiology No results found.  Procedures .Marland KitchenLaceration Repair Date/Time: 06/13/2018 1:03 PM Performed by: Hayden Rasmussen, MD Authorized by: Hayden Rasmussen, MD   Consent:    Consent obtained:  Verbal   Consent given by:  Patient   Risks discussed:  Infection, pain, poor cosmetic result, poor wound healing and retained foreign body   Alternatives discussed:  No treatment and delayed treatment Anesthesia (see MAR for exact dosages):    Anesthesia method:  Local infiltration   Local anesthetic:  Lidocaine 2% WITH epi  Laceration details:    Location:  Leg   Leg location:  L knee   Length (cm):  5 Repair type:    Repair type:  Simple Pre-procedure details:    Preparation:  Patient was prepped and draped in usual sterile fashion Exploration:    Wound exploration: wound explored through full range of motion     Contaminated: no   Treatment:    Area cleansed with:  Saline   Amount of cleaning:  Standard   Irrigation solution:  Sterile saline Skin repair:    Repair method:  Sutures   Suture size:  4-0   Suture material:  Nylon   Suture technique:  Simple interrupted   Number of sutures:  7 Approximation:    Approximation:  Close Post-procedure details:    Dressing:  Non-adherent dressing   Patient tolerance of procedure:  Tolerated well, no immediate complications   (including critical care time)  Medications Ordered in ED Medications  lidocaine-EPINEPHrine (XYLOCAINE W/EPI) 2 %-1:200000 (PF) injection 20 mL (has no administration in time range)     Initial Impression / Assessment and Plan / ED Course  I have reviewed the triage vital signs and the nursing notes.  Pertinent labs & imaging results that were available during my care of the patient were reviewed by me and considered in my medical decision making (see chart for details).          Final Clinical Impressions(s) / ED Diagnoses   Final diagnoses:  Knee laceration, left, initial encounter    ED Discharge Orders    None       Hayden Rasmussen, MD 06/13/18 [phone]

## 2018-06-13 NOTE — ED Triage Notes (Signed)
Laceration to L knee from a chain saw. Bleeding controlled.

## 2018-06-13 NOTE — Discharge Instructions (Addendum)
You were seen in the emergency department for a laceration of your left knee.  You had sutures placed that will need to be removed in 12 to 14 days.  You can use some antibiotic ointment over the area and keep it covered if it will be exposed to significant dirt.  Please return sooner if any signs of infection.

## 2018-06-13 NOTE — ED Notes (Signed)
Pt verbalized understanding of dc instructions.

## 2018-11-10 DIAGNOSIS — E875 Hyperkalemia: Secondary | ICD-10-CM | POA: Insufficient documentation

## 2018-11-10 DIAGNOSIS — N400 Enlarged prostate without lower urinary tract symptoms: Secondary | ICD-10-CM | POA: Insufficient documentation

## 2018-11-10 DIAGNOSIS — M545 Low back pain, unspecified: Secondary | ICD-10-CM | POA: Insufficient documentation

## 2018-11-27 ENCOUNTER — Other Ambulatory Visit: Payer: Self-pay

## 2018-11-27 ENCOUNTER — Encounter: Payer: Self-pay | Admitting: Pulmonary Disease

## 2018-11-27 ENCOUNTER — Ambulatory Visit (INDEPENDENT_AMBULATORY_CARE_PROVIDER_SITE_OTHER): Payer: 59 | Admitting: Pulmonary Disease

## 2018-11-27 VITALS — BP 130/80 | HR 67 | Temp 97.3°F | Ht 72.0 in | Wt 216.6 lb

## 2018-11-27 DIAGNOSIS — G4739 Other sleep apnea: Secondary | ICD-10-CM

## 2018-11-27 DIAGNOSIS — Z9889 Other specified postprocedural states: Secondary | ICD-10-CM

## 2018-11-27 DIAGNOSIS — Z7189 Other specified counseling: Secondary | ICD-10-CM

## 2018-11-27 DIAGNOSIS — G4733 Obstructive sleep apnea (adult) (pediatric): Secondary | ICD-10-CM | POA: Diagnosis not present

## 2018-11-27 DIAGNOSIS — Z9989 Dependence on other enabling machines and devices: Secondary | ICD-10-CM

## 2018-11-27 NOTE — Progress Notes (Signed)
  Subjective:     Patient ID: Jared Contreras, male   DOB: 05-Nov-1960, 58 y.o.   MRN: [id number]  HPI   Review of Systems  Constitutional: Negative.   HENT: Negative.   Eyes: Negative.   Respiratory: Positive for apnea and choking.   Cardiovascular: Negative.   Gastrointestinal: Negative.   Endocrine: Negative.   Genitourinary: Negative.   Musculoskeletal: Positive for back pain.  Skin: Negative.   Allergic/Immunologic: Positive for environmental allergies.  Neurological: Negative.   Hematological: Negative.   Psychiatric/Behavioral: Negative.        Objective:   Physical Exam     Assessment:         Plan:

## 2018-11-27 NOTE — Patient Instructions (Signed)
Will arrange for new CPAP machine  Follow up in 2 months 

## 2018-11-27 NOTE — Addendum Note (Signed)
Addended by: Nena Polio on: 11/27/2018 10:39 AM   Modules accepted: Orders

## 2018-11-27 NOTE — Progress Notes (Signed)
[facility]  Chief Complaint  Patient presents with  . Consult    Obstructive Sleep Apnea. Has CPAP Machine.    Constitutional:  BP 130/80 (BP Location: Right Arm, Patient Position: Sitting, Cuff Size: Normal)   Pulse 67   Temp (!) 97.3 F (36.3 C)   Ht 6' (1.829 m)   Wt 216 lb 9.6 oz (98.2 kg)   SpO2 100% Comment: on room air  BMI 29.38 kg/m   Past Medical History:  Arthritis, BPH  Brief Summary:  Jared Contreras is a 58 y.o. male with obstructive sleep apnea.  He had prior UPPP.  I last saw him in 2015.  He was previously seen by Dr. Wilburn Cornelia with ENT.  Had UPPP, but sleep apnea recurred.  He had sleep study in 2015 that showed severe sleep apnea.  Developed central apneas with CPAP.  Had titration study and seemed that Bipap 19/15 was best option.  He was lost to follow up.  He has been using CPAP at 14 cm H2O.  He has S9 machine, and thinks his machine is at least 60 years old.  He was sleeping okay with this until recently.  Now he doesn't feel like machine is delivering pressure properly.  He has been getting regular supplies through [facility].  He goes to sleep at 11 pm.  He falls asleep in 30 minutes.  He wakes up some times to use the bathroom.  He gets out of bed at 6 am.  He feels okay in the morning.  He denies morning headache.  He does not use anything to help him fall sleep or stay awake.  He denies sleep walking, sleep talking, bruxism, or nightmares.  There is no history of restless legs.  He denies sleep hallucinations, sleep paralysis, or cataplexy.  The Epworth score is 11 out of 24.   Physical Exam:   Appearance - well kempt   ENMT - clear nasal mucosa, midline nasal  septum, no oral exudates, no LAN, trachea midline, retrognathic, MP 2, s/p UPPP, high arched palate  Respiratory - normal chest wall, normal respiratory effort, no accessory muscle use, no wheeze/rales  CV - s1s2 regular rate and rhythm, no  murmurs, no peripheral edema, radial pulses symmetric  GI - soft, non tender, no masses  Lymph - no adenopathy noted in neck and axillary areas  MSK - normal gait  Ext - no cyanosis, clubbing, or joint inflammation noted  Skin - no rashes, lesions, or ulcers  Neuro - normal strength, oriented x 3  Psych - normal mood and affect   Assessment/Plan:   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 - he reports compliance with therapy - his machine is very old, no longer working properly and wouldn't be amenable to repair - will arrange for auto CPAP range 10 to 20 cm H2O - advised he might need repeat home sleep study for insurance coverage prior to getting new CPAP machine - will review his download after he is started on new auto CPAP and then determine if he needs repeat in lab titration study - discussed mask fit techniques   Patient Instructions  Will arrange for new CPAP machine   Follow up in 2 months    [name], MD [place] Pager: [phone] 11/27/2018, 10:02 AM  Flow Sheet    Sleep tests:  PSG 06/24/13 >> AHI 60.5, SpO2 low 81% CPAP 01/05/16 to 03/04/16 >> used on 60 of 60  nights with average 7 hrs 23 min.  Average AHI 15.1 with CPAP 14 cm H2O  Review of Systems:  Constitutional: Negative.   HENT: Negative.   Eyes: Negative.   Respiratory: Positive for apnea and choking.   Cardiovascular: Negative.   Gastrointestinal: Negative.   Endocrine: Negative.   Genitourinary: Negative.   Musculoskeletal: Positive for back pain.  Skin: Negative.   Allergic/Immunologic: Positive for environmental allergies.  Neurological: Negative.   Hematological: Negative.   Psychiatric/Behavioral: Negative.    Medications:   Allergies as of 11/27/2018      Reactions   Asa [aspirin] Anaphylaxis      Medication List       Accurate as of November 27, 2018 10:02 AM. If you have any questions, ask your nurse or doctor.         celecoxib 200 MG capsule Commonly known as: CELEBREX Take 1 capsule by mouth as needed.       Past Surgical History:  He  has a past surgical history that includes Uvulopalatopharyngoplasty (uppp)/tonsillectomy/septoplasty.  Family History:  His family history includes Alcoholism in his father; Skin cancer in his mother; Suicidality in his father.  Social History:  He  reports that he has never smoked. He has never used smokeless tobacco. He reports that he does not drink alcohol or use drugs.

## 2019-01-12 ENCOUNTER — Telehealth: Payer: Self-pay | Admitting: Pulmonary Disease

## 2019-01-12 DIAGNOSIS — G4733 Obstructive sleep apnea (adult) (pediatric): Secondary | ICD-10-CM

## 2019-01-12 DIAGNOSIS — Z9989 Dependence on other enabling machines and devices: Secondary | ICD-10-CM

## 2019-01-12 NOTE — Telephone Encounter (Signed)
Pt returning call.  [phone]

## 2019-01-12 NOTE — Telephone Encounter (Signed)
I called and spoke with the patient and he states that he thinks that his settings need to be adjusted. He states that when he's asleep and it ramps up above 15 the mask is blowing off his face. He states that he then takes it off and wont put it back on. He is currently on an auto setting. His DME is [employer]. Please advise.

## 2019-01-12 NOTE — Telephone Encounter (Signed)
LMTCB

## 2019-01-13 NOTE — Telephone Encounter (Signed)
DME order sent  [facility] for the pt

## 2019-01-13 NOTE — Telephone Encounter (Signed)
Please send order to have auto CPAP change to 8 - 14 cm H2O

## 2019-01-14 NOTE — Telephone Encounter (Signed)
Called and spoke to pt. Informed him of the pressure change. Pt aware to contact our office if he does not hear from the DME company within a week. Pt verbalized understanding and denied any further questions or concerns at this time.

## 2019-01-29 ENCOUNTER — Ambulatory Visit: Payer: 59 | Admitting: Pulmonary Disease

## 2019-03-04 ENCOUNTER — Ambulatory Visit (INDEPENDENT_AMBULATORY_CARE_PROVIDER_SITE_OTHER): Payer: 59 | Admitting: Adult Health

## 2019-03-04 ENCOUNTER — Ambulatory Visit: Payer: 59 | Admitting: Pulmonary Disease

## 2019-03-04 ENCOUNTER — Other Ambulatory Visit: Payer: Self-pay

## 2019-03-04 ENCOUNTER — Encounter: Payer: Self-pay | Admitting: Adult Health

## 2019-03-04 DIAGNOSIS — G4733 Obstructive sleep apnea (adult) (pediatric): Secondary | ICD-10-CM

## 2019-03-04 DIAGNOSIS — Z9989 Dependence on other enabling machines and devices: Secondary | ICD-10-CM | POA: Diagnosis not present

## 2019-03-04 NOTE — Progress Notes (Signed)
Reviewed and agree with assessment/plan.   Hayly Litsey, MD [facility] 02/07/2016, 12:24 PM Pager:  [phone]  

## 2019-03-04 NOTE — Progress Notes (Signed)
Virtual Visit via Telephone Note  I connected with Jared Contreras on 03/04/19 at  9:15 AM EST by telephone and verified that I am speaking with the correct person using two identifiers.  Location: Patient: Home  Provider: Office    I discussed the limitations, risks, security and privacy concerns of performing an evaluation and management service by telephone and the availability of in person appointments. I also discussed with the patient that there may be a patient responsible charge related to this service. The patient expressed understanding and agreed to proceed.   History of Present Illness: 59 year old male followed for severe obstructive sleep apnea on nocturnal CPAP  Today's televisit is a 70-month follow-up for sleep apnea.  Patient has known severe obstructive sleep apnea.  Was recently seen in the office, he has received a new CPAP machine as his previous one was very old.  Says he is doing well on CPAP . Wears every night and never goes without it. Feels rested and feels he benefits from CPAP . Download shows excellent compliance at 100% usage. Daily average usage at 7.5 hr , AHI 10.6/hr . Average pressure usage ~13-14 cmH20  Using the full face dreamwear mask. Feels like it is starting to get comfortable now . At times irritates around ear but better over last week.      Observations/Objective: PSG 06/24/13 >> AHI 60.5, SpO2 low 81%  CPAP 01/05/16 to 03/04/16 >> used on 60 of 60 nights with average 7 hrs 23 min.  Average AHI 15.1 with CPAP 14 cm H2O   Assessment and Plan:  Severe OSA - excellent compliance on CPAP -  Will adjust pressure to see if can improve control . (although improved control from last download in 2018) .   Plan  Patient Instructions  Keep up the good work Continue on CPAP at bedtime Change CPAP pressure 8 to 16 cm H2O CPAP download in 1 month Work on healthy weight Do not drive if sleepy Follow-up in 1 year with Dr. Halford Chessman and as needed     Follow Up  Instructions: Follow up in 1 year and As needed     I discussed the assessment and treatment plan with the patient. The patient was provided an opportunity to ask questions and all were answered. The patient agreed with the plan and demonstrated an understanding of the instructions.   The patient was advised to call back or seek an in-person evaluation if the symptoms worsen or if the condition fails to improve as anticipated.  I provided 22 minutes of non-face-to-face time during this encounter.   Rexene Edison, NP

## 2019-03-04 NOTE — Addendum Note (Signed)
Addended by: Parke Poisson E on: 03/04/2019 02:24 PM   Modules accepted: Orders

## 2019-03-04 NOTE — Patient Instructions (Signed)
Keep up the good work Continue on CPAP at bedtime Change CPAP pressure 8 to 16 cm H2O CPAP download in 1 month Work on healthy weight Do not drive if sleepy Follow-up in 1 year with Dr. Halford Chessman and as needed

## 2019-03-30 ENCOUNTER — Ambulatory Visit: Payer: 59 | Attending: Internal Medicine

## 2019-03-30 ENCOUNTER — Other Ambulatory Visit: Payer: 59

## 2019-03-30 DIAGNOSIS — Z20822 Contact with and (suspected) exposure to covid-19: Secondary | ICD-10-CM

## 2019-03-31 LAB — NOVEL CORONAVIRUS, NAA: SARS-CoV-2, NAA: NOT DETECTED

## 2020-12-14 ENCOUNTER — Other Ambulatory Visit: Payer: Self-pay | Admitting: Internal Medicine

## 2020-12-14 DIAGNOSIS — R7303 Prediabetes: Secondary | ICD-10-CM

## 2021-01-18 ENCOUNTER — Other Ambulatory Visit: Payer: 59

## 2021-02-08 ENCOUNTER — Ambulatory Visit
Admission: RE | Admit: 2021-02-08 | Discharge: 2021-02-08 | Disposition: A | Discharge status: Home / Self Care | Payer: No Typology Code available for payment source | Source: Ambulatory Visit | Attending: Internal Medicine | Admitting: Internal Medicine

## 2021-02-08 DIAGNOSIS — R7303 Prediabetes: Secondary | ICD-10-CM

## 2022-05-28 ENCOUNTER — Ambulatory Visit: Payer: 59 | Admitting: Podiatry

## 2022-05-28 ENCOUNTER — Encounter: Payer: Self-pay | Admitting: Podiatry

## 2022-05-28 DIAGNOSIS — L600 Ingrowing nail: Secondary | ICD-10-CM

## 2022-05-28 MED ORDER — NEOMYCIN-POLYMYXIN-HC 1 % OT SOLN: OTIC | 1 refills | Status: DC

## 2022-05-28 NOTE — Progress Notes (Signed)
  Subjective:  Patient ID: Jared Contreras, male    DOB: 1960-07-15,  MRN: [id number] HPI Chief Complaint  Patient presents with   Toe Pain    Hallux right - medial border, ingrown x couple years, usually trims out, but been red, sore and swollen for few weeks, toenail has fallen off before from fungus  3rd toe right - cyst on toe   New Patient (Initial Visit)    62 y.o. male presents with the above complaint.   ROS: Denies fever chills nausea vomit muscle aches pains calf pain back pain chest pain shortness of breath.  Past Medical History:  Diagnosis Date   OSA (obstructive sleep apnea)    Past Surgical History:  Procedure Laterality Date   UVULOPALATOPHARYNGOPLASTY (UPPP)/TONSILLECTOMY/SEPTOPLASTY      Current Outpatient Medications:    NEOMYCIN-POLYMYXIN-HYDROCORTISONE (CORTISPORIN) 1 % SOLN OTIC solution, Apply 1-2 drops to toe BID after soaking, Disp: 10 mL, Rfl: 1   celecoxib (CELEBREX) 200 MG capsule, Take 1 capsule by mouth as needed., Disp: , Rfl:   Allergies  Allergen Reactions   Asa [Aspirin] Anaphylaxis   Review of Systems Objective:  There were no vitals filed for this visit.  General: Well developed, nourished, in no acute distress, alert and oriented x3   Dermatological: Skin is warm, dry and supple bilateral. Nails x 10 are well maintained; remaining integument appears unremarkable at this time. There are no open sores, no preulcerative lesions, no rash or signs of infection present.  Ingrown toenails tibial-fibular border the hallux right today mild erythema no cellulitis drainage or odor exquisitely tender on palpation.  Third digit overlying the DIPJ medially does demonstrate a mucoid cyst.  Vascular: Dorsalis Pedis artery and Posterior Tibial artery pedal pulses are 2/4 bilateral with immedate capillary fill time. Pedal hair growth present. No varicosities and no lower extremity edema present bilateral.   Neruologic: Grossly intact via light touch  bilateral. Vibratory intact via tuning fork bilateral. Protective threshold with Semmes Wienstein monofilament intact to all pedal sites bilateral. Patellar and Achilles deep tendon reflexes 2+ bilateral. No Babinski or clonus noted bilateral.   Musculoskeletal: No gross boney pedal deformities bilateral. No pain, crepitus, or limitation noted with foot and ankle range of motion bilateral. Muscular strength 5/5 in all groups tested bilateral.  Osteoarthritis third digit right  Gait: Unassisted, Nonantalgic.    Radiographs:  None taken  Assessment & Plan:   Assessment: Ingrown toenail tibial-fibular border hallux right.  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  Was given both oral and written home-going instruction for care and soaking of the toe.  Also discussed surgical removal of the mucoid cyst.  Not only did he receive instructions for the soaking of the toe but a prescription for Cortisporin Otic to be applied twice daily after soaking.     Tyan Lasure T. Maharishi [place], [place]

## 2022-05-28 NOTE — Patient Instructions (Signed)

## 2022-06-13 ENCOUNTER — Ambulatory Visit: Payer: 59 | Admitting: Podiatry

## 2022-07-02 ENCOUNTER — Ambulatory Visit: Payer: 59 | Admitting: Podiatry

## 2022-07-02 ENCOUNTER — Encounter: Payer: Self-pay | Admitting: Podiatry

## 2022-07-02 DIAGNOSIS — M67471 Ganglion, right ankle and foot: Secondary | ICD-10-CM

## 2022-07-02 NOTE — Progress Notes (Signed)
He presents today for follow-up of his matrixectomy fibular border hallux right.  States he is doing fine no problems a little bit tender right here as he points to the proximal nail fold where the chemical matricectomy was performed.  He is also complaining of pain to the third DIPJ wants a estimate of how much of the cost to have the cyst removed.  Objective: Vital signs stable oriented x 3 patient mild erythema to proximal nail fold no purulence no malodor some mild tenderness on palpation to the area.  Otherwise appears to be healing normally.  He does have a mucoid cyst to the DIPJ third digit right foot.  Assessment: DIPJ the osteoarthritis capsulitis mucoid cyst and well-healing matrixectomy.  Plan: Discussed etiology pathology and surgical therapies at this point were going to request an estimate for surgical facility for an arthroplasty with excision of the cyst and anesthesia.

## 2023-09-03 ENCOUNTER — Ambulatory Visit: Admitting: Podiatry

## 2023-09-03 DIAGNOSIS — M67471 Ganglion, right ankle and foot: Secondary | ICD-10-CM | POA: Diagnosis not present

## 2023-09-03 NOTE — Progress Notes (Signed)
   Chief Complaint  Patient presents with   Cyst    Right 3rd toe. Previously seen by Dr. Verta and this was briefly discussed. Does report the cyst fills up and he gets the fluid out at home. Clear and thick in nature. Otherwise does not bother him. Not diabetic, not on anticoag.     HPI: 63 y.o. 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  It has been present for over 3 years now.  He continues to drain the lesion but it continues to recur.  He presents for further treatment evaluation.  Past Medical History:  Diagnosis Date   OSA (obstructive sleep apnea)     Past Surgical History:  Procedure Laterality Date   UVULOPALATOPHARYNGOPLASTY (UPPP)/TONSILLECTOMY/SEPTOPLASTY      Allergies  Allergen Reactions   Asa [Aspirin] Anaphylaxis     Physical Exam: General: The patient is alert and oriented x3 in no acute distress.  Dermatology: Skin is warm, dry and supple bilateral lower extremities.  Mucoid cyst lesion noted overlying the right third digit with associated tenderness to palpation  Vascular: Palpable pedal pulses bilaterally. Capillary refill within normal limits.  No appreciable edema.  No erythema.  Neurological: Grossly intact via light touch  Musculoskeletal Exam: No pedal deformities noted.  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 Mucoid cyst right third toe  -Patient evaluated.  He has now been dealing with a mucoid cyst for over 3 years now.  He has tried conservative treatment with no improvement and it has not resolved.  I do believe the arthroplasty of the DIPJ is appropriate at this time.  This was discussed in detail with the patient and he is ready proceed with surgery.  Risk benefits advantages and disadvantages of the procedure were explained.  All patient questions were answered.  No guarantees were expressed or implied -Authorization for surgery was  initiated today.  Surgery will consist of right third toe arthroplasty of the DIPJ -Return to clinic 1 week postop       Thresa EMERSON Sar, DPM [facility]  Dr. Thresa EMERSON Sar, DPM    [address] [place], [place] [address]                Office [phone]  Fax [phone]

## 2023-09-09 ENCOUNTER — Telehealth: Payer: Self-pay | Admitting: Podiatry

## 2023-09-09 NOTE — Telephone Encounter (Signed)
 Received surgical consent forms.  Left message for pt to call to get his surgery scheduled.

## 2023-10-02 ENCOUNTER — Telehealth: Payer: Self-pay | Admitting: Podiatry

## 2023-10-02 NOTE — Telephone Encounter (Signed)
 DOS- 10/16/2023  3RD HAMMERTOE REPAIR RT- [id number]  [facility] EFFECTIVE DATE- 02/12/2023  DEDUCTIBLE- $0 REMAINING- $0 OOP- $5000 REMAINING- $4755.87 FAMILY OOP- $10000 REMAINING- $9755.87 COINSURANCE- 20%/$150 COPAY  PER [employer] WEBSITE, PRIOR AUTH FOR CPT CODE 71714 HAS BEEN APPROVED FROM 10/16/2023-01/14/2024. AUTH# [id number]

## 2023-10-16 ENCOUNTER — Other Ambulatory Visit: Payer: Self-pay | Admitting: Podiatry

## 2023-10-16 DIAGNOSIS — M2041 Other hammer toe(s) (acquired), right foot: Secondary | ICD-10-CM | POA: Diagnosis not present

## 2023-10-16 MED ORDER — IBUPROFEN 800 MG PO TABS: 800.0000 mg | ORAL_TABLET | Freq: Three times a day (TID) | ORAL | 1 refills | Status: AC

## 2023-10-16 MED ORDER — OXYCODONE-ACETAMINOPHEN 5-325 MG PO TABS: 1.0000 | ORAL_TABLET | ORAL | 0 refills | Status: AC | PRN

## 2023-10-16 NOTE — Progress Notes (Signed)
 PRN postop

## 2023-10-22 ENCOUNTER — Ambulatory Visit (INDEPENDENT_AMBULATORY_CARE_PROVIDER_SITE_OTHER): Admitting: Podiatry

## 2023-10-22 ENCOUNTER — Encounter: Payer: Self-pay | Admitting: Podiatry

## 2023-10-22 ENCOUNTER — Ambulatory Visit (INDEPENDENT_AMBULATORY_CARE_PROVIDER_SITE_OTHER)

## 2023-10-22 VITALS — Ht 72.0 in | Wt 216.6 lb

## 2023-10-22 DIAGNOSIS — M67471 Ganglion, right ankle and foot: Secondary | ICD-10-CM | POA: Diagnosis not present

## 2023-10-22 NOTE — Progress Notes (Signed)
   Chief Complaint  Patient presents with   Routine Post Op    POV # 1 DOS 10/16/23 RT 3RD HAMMERTOE ARTHROPLASTY, pt states everything is going well, really no pain unless he walks on the foot wrong, no other complaints.    Subjective:  Patient presents today status post DIPJ arthroplasty of the third digit right foot.  DOS: 10/16/2023.  Doing well.  No new complaints  Past Medical History:  Diagnosis Date   OSA (obstructive sleep apnea)     Past Surgical History:  Procedure Laterality Date   UVULOPALATOPHARYNGOPLASTY (UPPP)/TONSILLECTOMY/SEPTOPLASTY      Allergies  Allergen Reactions   Asa [Aspirin] Anaphylaxis    Objective/Physical Exam Neurovascular status intact.  Incision well coapted with sutures intact. No sign of infectious process noted. No dehiscence. No active bleeding noted.  Moderate edema noted to the surgical toe  Radiographic Exam RT foot 10/22/2023:  Arthroplasty noted to the DIPJ of the right third toe with routine healing.  Assessment: 1. s/p DIPJ arthroplasty right third toe. DOS: 10/16/2023   Plan of Care:  -Patient was evaluated. X-rays reviewed - Dressings changed.  Recommend antibiotic ointment and Band-Aid daily. -Okay to wash and shower and get the foot wet -Return to clinic 1 week suture removal   Thresa EMERSON Sar, DPM [facility]  Dr. Thresa EMERSON Sar, DPM    [address] [place], [place] [address]                Office [phone]  Fax [phone]

## 2023-10-29 ENCOUNTER — Ambulatory Visit (INDEPENDENT_AMBULATORY_CARE_PROVIDER_SITE_OTHER): Admitting: Podiatry

## 2023-10-29 DIAGNOSIS — M67471 Ganglion, right ankle and foot: Secondary | ICD-10-CM

## 2023-10-29 NOTE — Progress Notes (Signed)
   Chief Complaint  Patient presents with   Routine Post Op    POV # 1 DOS 10/16/23 RT 3RD HAMMERTOE ARTHROPLASTY, pt is here to f/u on right foot after surgery he states everything is going well has no complaints.    Subjective:  Patient presents today status post DIPJ arthroplasty of the third digit right foot.  DOS: 10/16/2023.  Doing well.  No new complaints  Past Medical History:  Diagnosis Date   OSA (obstructive sleep apnea)     Past Surgical History:  Procedure Laterality Date   UVULOPALATOPHARYNGOPLASTY (UPPP)/TONSILLECTOMY/SEPTOPLASTY      Allergies  Allergen Reactions   Asa [Aspirin] Anaphylaxis    Objective/Physical Exam Neurovascular status intact.  Incision well coapted with sutures intact. No sign of infectious process noted. No dehiscence. No active bleeding noted.  Moderate edema noted to the surgical toe  Radiographic Exam RT foot 10/22/2023:  Arthroplasty noted to the DIPJ of the right third toe with routine healing.  Assessment: 1. s/p DIPJ arthroplasty right third toe. DOS: 10/16/2023   Plan of Care:  -Patient was evaluated.  -Sutures removed -Resume regular tennis shoes -Patient may slowly increase to full activity no restrictions -Return to clinic PRN   Thresa EMERSON Sar, DPM [facility]  Dr. Thresa EMERSON Sar, DPM    [address] [place], [place] [address]                Office [phone]  Fax [phone]

## 2023-11-12 ENCOUNTER — Encounter: Admitting: Podiatry
